# Patient Record
Sex: Male | Born: 1960 | Race: Black or African American | Hispanic: No | Marital: Single | Smoking: Current every day smoker
Health system: Southern US, Community
[De-identification: ages and names within clinical notes are randomized; demographics above are authoritative.]

## PROBLEM LIST (undated history)

## (undated) DIAGNOSIS — I10 Essential (primary) hypertension: Secondary | ICD-10-CM

---

## 2002-02-25 ENCOUNTER — Emergency Department (HOSPITAL_COMMUNITY): Admission: EM | Admit: 2002-02-25 | Discharge: 2002-02-26 | Payer: Self-pay | Admitting: Emergency Medicine

## 2004-01-11 ENCOUNTER — Emergency Department (HOSPITAL_COMMUNITY): Admission: EM | Admit: 2004-01-11 | Discharge: 2004-01-12 | Payer: Self-pay | Admitting: Emergency Medicine

## 2005-07-17 ENCOUNTER — Emergency Department (HOSPITAL_COMMUNITY): Admission: EM | Admit: 2005-07-17 | Discharge: 2005-07-17 | Payer: Self-pay | Admitting: *Deleted

## 2013-04-09 ENCOUNTER — Emergency Department (HOSPITAL_COMMUNITY)
Admission: EM | Admit: 2013-04-09 | Discharge: 2013-04-10 | Disposition: A | Discharge status: Home / Self Care | Payer: Medicaid Other | Attending: Emergency Medicine | Admitting: Emergency Medicine

## 2013-04-09 ENCOUNTER — Encounter (HOSPITAL_COMMUNITY): Payer: Self-pay | Admitting: *Deleted

## 2013-04-09 DIAGNOSIS — R079 Chest pain, unspecified: Secondary | ICD-10-CM | POA: Insufficient documentation

## 2013-04-09 DIAGNOSIS — R0602 Shortness of breath: Secondary | ICD-10-CM | POA: Insufficient documentation

## 2013-04-09 DIAGNOSIS — M79609 Pain in unspecified limb: Secondary | ICD-10-CM | POA: Insufficient documentation

## 2013-04-09 NOTE — ED Notes (Signed)
Pt states he was sitting at home when he had sharp pain going from his legs up to his chest.

## 2013-04-09 NOTE — ED Notes (Signed)
Pt to department via EMS.  Pt c/o pain in leg that shot up to his chest.  Pt does report mild SOB, and improvement in pain at this time. Pt reporting pain is stabbing and presently more in center of chest.  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

## 2013-04-10 ENCOUNTER — Emergency Department (HOSPITAL_COMMUNITY): Payer: Medicaid Other

## 2013-04-10 LAB — BASIC METABOLIC PANEL
BUN: 12 mg/dL (ref 6–23)
CO2: 19 mEq/L (ref 19–32)
Calcium: 9.1 mg/dL (ref 8.4–10.5)
Creatinine, Ser: 0.93 mg/dL (ref 0.50–1.35)

## 2013-04-10 LAB — CBC WITH DIFFERENTIAL/PLATELET
Basophils Absolute: 0 10*3/uL (ref 0.0–0.1)
Eosinophils Relative: 3 % (ref 0–5)
HCT: 43.7 % (ref 39.0–52.0)
Lymphocytes Relative: 39 % (ref 12–46)
MCHC: 35.9 g/dL (ref 30.0–36.0)
MCV: 98.2 fL (ref 78.0–100.0)
Monocytes Absolute: 0.8 10*3/uL (ref 0.1–1.0)
Monocytes Relative: 10 % (ref 3–12)
RDW: 13.9 % (ref 11.5–15.5)

## 2013-04-10 LAB — POCT I-STAT TROPONIN I: Troponin i, poc: 0 ng/mL (ref 0.00–0.08)
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
## 2017-11-11 ENCOUNTER — Other Ambulatory Visit (HOSPITAL_COMMUNITY): Payer: Self-pay | Admitting: Internal Medicine

## 2017-11-11 ENCOUNTER — Ambulatory Visit (HOSPITAL_COMMUNITY)
Admission: RE | Admit: 2017-11-11 | Discharge: 2017-11-11 | Disposition: A | Discharge status: Home / Self Care | Payer: Medicaid Other | Source: Ambulatory Visit | Attending: Internal Medicine | Admitting: Internal Medicine

## 2017-11-11 DIAGNOSIS — R059 Cough, unspecified: Secondary | ICD-10-CM

## 2017-11-11 DIAGNOSIS — R0989 Other specified symptoms and signs involving the circulatory and respiratory systems: Secondary | ICD-10-CM

## 2017-11-11 DIAGNOSIS — R918 Other nonspecific abnormal finding of lung field: Secondary | ICD-10-CM | POA: Insufficient documentation

## 2017-11-11 DIAGNOSIS — R05 Cough: Secondary | ICD-10-CM | POA: Diagnosis not present

## 2017-11-11 DIAGNOSIS — Q240 Dextrocardia: Secondary | ICD-10-CM | POA: Diagnosis not present
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
## 2018-08-25 ENCOUNTER — Ambulatory Visit: Payer: Medicaid Other

## 2018-08-25 ENCOUNTER — Telehealth: Payer: Self-pay

## 2018-08-25 NOTE — Telephone Encounter (Signed)
PATIENT WAS A NO SHOW AND LETTER SENT  °

## 2018-08-25 NOTE — Telephone Encounter (Signed)
noted 

## 2018-08-26 ENCOUNTER — Telehealth: Payer: Self-pay

## 2018-08-26 ENCOUNTER — Ambulatory Visit: Payer: Medicaid Other

## 2018-08-26 NOTE — Telephone Encounter (Signed)
PATIENT WAS A NO SHOW AND LETTER SENT  °

## 2018-08-26 NOTE — Telephone Encounter (Signed)
noted 

## 2019-11-26 ENCOUNTER — Other Ambulatory Visit: Payer: Self-pay

## 2019-11-26 ENCOUNTER — Encounter (HOSPITAL_COMMUNITY): Payer: Self-pay

## 2019-11-26 ENCOUNTER — Emergency Department (HOSPITAL_COMMUNITY)
Admission: EM | Admit: 2019-11-26 | Discharge: 2019-11-26 | Disposition: A | Discharge status: Home / Self Care | Payer: Medicaid Other | Attending: Emergency Medicine | Admitting: Emergency Medicine

## 2019-11-26 DIAGNOSIS — F17210 Nicotine dependence, cigarettes, uncomplicated: Secondary | ICD-10-CM | POA: Diagnosis not present

## 2019-11-26 DIAGNOSIS — R1110 Vomiting, unspecified: Secondary | ICD-10-CM | POA: Diagnosis not present

## 2019-11-26 DIAGNOSIS — I10 Essential (primary) hypertension: Secondary | ICD-10-CM | POA: Insufficient documentation

## 2019-11-26 HISTORY — DX: Essential (primary) hypertension: I10

## 2019-11-26 LAB — CBC WITH DIFFERENTIAL/PLATELET
Abs Immature Granulocytes: 0.03 10*3/uL (ref 0.00–0.07)
Basophils Absolute: 0 10*3/uL (ref 0.0–0.1)
Basophils Relative: 1 %
Eosinophils Absolute: 0 10*3/uL (ref 0.0–0.5)
Eosinophils Relative: 1 %
HCT: 42.6 % (ref 39.0–52.0)
Hemoglobin: 14.3 g/dL (ref 13.0–17.0)
Immature Granulocytes: 1 %
Lymphocytes Relative: 22 %
Lymphs Abs: 1.4 10*3/uL (ref 0.7–4.0)
MCH: 34 pg (ref 26.0–34.0)
MCHC: 33.6 g/dL (ref 30.0–36.0)
MCV: 101.4 fL — ABNORMAL HIGH (ref 80.0–100.0)
Monocytes Absolute: 0.7 10*3/uL (ref 0.1–1.0)
Monocytes Relative: 10 %
Neutro Abs: 4.4 10*3/uL (ref 1.7–7.7)
Neutrophils Relative %: 65 %
Platelets: 259 10*3/uL (ref 150–400)
RBC: 4.2 MIL/uL — ABNORMAL LOW (ref 4.22–5.81)
RDW: 14 % (ref 11.5–15.5)
WBC: 6.5 10*3/uL (ref 4.0–10.5)
nRBC: 0 % (ref 0.0–0.2)

## 2019-11-26 LAB — COMPREHENSIVE METABOLIC PANEL
ALT: 60 U/L — ABNORMAL HIGH (ref 0–44)
AST: 58 U/L — ABNORMAL HIGH (ref 15–41)
Albumin: 3.7 g/dL (ref 3.5–5.0)
Alkaline Phosphatase: 89 U/L (ref 38–126)
Anion gap: 11 (ref 5–15)
BUN: 11 mg/dL (ref 6–20)
CO2: 20 mmol/L — ABNORMAL LOW (ref 22–32)
Calcium: 8.9 mg/dL (ref 8.9–10.3)
Chloride: 107 mmol/L (ref 98–111)
Creatinine, Ser: 0.8 mg/dL (ref 0.61–1.24)
GFR calc Af Amer: >60 mL/min (ref >60)
GFR calc non Af Amer: >60 mL/min (ref >60)
Glucose, Bld: 101 mg/dL — ABNORMAL HIGH (ref 70–99)
Potassium: 3.5 mmol/L (ref 3.5–5.1)
Sodium: 138 mmol/L (ref 135–145)
Total Bilirubin: 0.6 mg/dL (ref 0.3–1.2)
Total Protein: 8.2 g/dL — ABNORMAL HIGH (ref 6.5–8.1)

## 2019-11-26 MED ORDER — AMLODIPINE BESYLATE 5 MG PO TABS: 5.0000 mg | ORAL_TABLET | Freq: Every day | ORAL | 1 refills | Status: DC

## 2019-11-26 MED ORDER — AMLODIPINE BESYLATE 10 MG PO TABS: 10.0000 mg | ORAL_TABLET | Freq: Every day | ORAL | 1 refills | Status: AC

## 2019-11-26 MED ORDER — AMLODIPINE BESYLATE 5 MG PO TABS
5.0000 mg | ORAL_TABLET | Freq: Once | ORAL | Status: AC
  Administered 2019-11-26: 11:00:00 5 mg via ORAL
  Filled 2019-11-26: qty 1

## 2019-11-26 NOTE — ED Provider Notes (Signed)
[facility] EMERGENCY DEPARTMENT Provider Note   CSN: [id number] Arrival date & time: 11/26/19  8295     History Chief Complaint  Patient presents with  . Hypertension    Henry Hunt is a 58 y.o. male.  Patient presents after episode of elevated blood pressure vomiting 3 times and feeling generalized shakiness this morning.  Symptoms have all resolved.  No unilateral weakness, no vision loss, no headache, no speech changes.  No history of stroke or cardiac history.  Patient has not taken his blood pressure medication in the past month.  Patient currently has no signs or symptoms.        Past Medical History:  Diagnosis Date  . Hypertension     There are no problems to display for this patient.   History reviewed. No pertinent surgical history.     No family history on file.  Social History   Tobacco Use  . Smoking status: Current Every Day Smoker    Packs/day: 0.50    Years: 25.00    Pack years: 12.50    Types: Cigarettes  Substance Use Topics  . Alcohol use: Yes    Comment: 2 beers daily  . Drug use: No    Home Medications Prior to Admission medications   Medication Sig Start Date End Date Taking? Authorizing Provider  amLODipine (NORVASC) 5 MG tablet Take 1 tablet (5 mg total) by mouth daily. 11/26/19   Blane Ohara, MD    Allergies    Patient has no known allergies.  Review of Systems   Review of Systems  Constitutional: Negative for chills and fever.  HENT: Negative for congestion.   Eyes: Negative for visual disturbance.  Respiratory: Negative for shortness of breath.   Cardiovascular: Negative for chest pain.  Gastrointestinal: Positive for vomiting. Negative for abdominal pain and diarrhea.  Genitourinary: Negative for dysuria and flank pain.  Musculoskeletal: Negative for back pain, neck pain and neck stiffness.  Skin: Negative for rash.  Neurological: Negative for speech difficulty, weakness, light-headedness and headaches.     Physical Exam Updated Vital Signs BP (!) 147/92   Pulse 68   Temp 98 F (36.7 C) (Oral)   Resp 13   Ht 5\' 4"  (1.626 m)   Wt 86.2 kg   SpO2 100%   BMI 32.61 kg/m   Physical Exam Vitals and nursing note reviewed.  Constitutional:      Appearance: He is well-developed.  HENT:     Head: Normocephalic and atraumatic.  Eyes:     General:        Right eye: No discharge.        Left eye: No discharge.     Conjunctiva/sclera: Conjunctivae normal.  Neck:     Trachea: No tracheal deviation.  Cardiovascular:     Rate and Rhythm: Normal rate and regular rhythm.     Pulses: Normal pulses.     Heart sounds: No murmur.  Pulmonary:     Effort: Pulmonary effort is normal.     Breath sounds: Normal breath sounds.  Abdominal:     General: There is no distension.     Palpations: Abdomen is soft.     Tenderness: There is no abdominal tenderness. There is no guarding.  Musculoskeletal:        General: No swelling or tenderness. Normal range of motion.     Cervical back: Normal range of motion and neck supple.  Skin:    General: Skin is warm.  Capillary Refill: Capillary refill takes less than 2 seconds.     Findings: No rash.  Neurological:     Mental Status: He is alert and oriented to person, place, and time.     GCS: GCS eye subscore is 4. GCS verbal subscore is 5. GCS motor subscore is 6.     Comments: 5+ strength in UE and LE with f/e at major joints. Sensation to palpation intact in UE and LE. CNs 2-12 grossly intact.  EOMFI.  PERRL.   Finger nose and coordination intact bilateral.   Visual fields intact to finger testing. No nystagmus   Psychiatric:        Mood and Affect: Mood normal.     ED Results / Procedures / Treatments   Labs (all labs ordered are listed, but only abnormal results are displayed) Labs Reviewed  CBC WITH DIFFERENTIAL/PLATELET - Abnormal; Notable for the following components:      Result Value   RBC 4.20 (*)    MCV 101.4 (*)    All  other components within normal limits  COMPREHENSIVE METABOLIC PANEL - Abnormal; Notable for the following components:   CO2 20 (*)    Glucose, Bld 101 (*)    Total Protein 8.2 (*)    AST 58 (*)    ALT 60 (*)    All other components within normal limits    EKG EKG Interpretation  Date/Time:  Friday November 26 2019 09:37:18 EST Ventricular Rate:  73 PR Interval:    QRS Duration: 94 QT Interval:  463 QTC Calculation: 511 R Axis:   165 Text Interpretation: Sinus rhythm Right ventricular hypertrophy Flattened T waves Prolonged QT interval Poor baseline Confirmed by Blane OharaZavitz, Sherri Levenhagen [phone]([id number]) on 11/26/2019 11:04:35 AM   Radiology No results found.  Procedures Procedures (including critical care time)  Medications Ordered in ED Medications  amLODipine (NORVASC) tablet 5 mg (has no administration in time range)    ED Course  I have reviewed the triage vital signs and the nursing notes.  Pertinent labs & imaging results that were available during my care of the patient were reviewed by me and considered in my medical decision making (see chart for details).    MDM Rules/Calculators/A&P                     Patient presents for evaluation with elevated blood pressure after episodes of vomiting.  Patient has mild elevated blood pressure in the ER.  Patient has no signs or symptoms.  Patient has normal cardiac and neurologic exam.  Blood work ordered and reviewed normal kidney function, normal hemoglobin.  Patient stable for close outpatient follow-up and strict reasons to return given.  Amlodipine first dose given in the ER.  Results and differential diagnosis were discussed with the patient/parent/guardian. Xrays were independently reviewed by myself.  Close follow up outpatient was discussed, comfortable with the plan.   Medications  amLODipine (NORVASC) tablet 5 mg (has no administration in time range)    Vitals:   11/26/19 0937 11/26/19 1000 11/26/19 1030  BP: (!) 182/97  (!) 150/97 (!) 147/92  Pulse: 74 (!) 58 68  Resp: 18 14 13   Temp: 98 F (36.7 C)    TempSrc: Oral    SpO2: 100% 99% 100%  Weight: 86.2 kg    Height: 5\' 4"  (1.626 m)      Final diagnoses:  Vomiting in adult  Essential hypertension    Final Clinical Impression(s) / ED Diagnoses Final diagnoses:  Vomiting in adult  Essential hypertension    Rx / DC Orders ED Discharge Orders         Ordered    amLODipine (NORVASC) 5 MG tablet  Daily     11/26/19 1122           Elnora Morrison, MD 11/26/19 1125

## 2019-11-26 NOTE — ED Triage Notes (Signed)
Pt called EMS due to feeling shaky and vomited. Pt has not taken BP meds in over a month initial BP 206/122, then 189/106

## 2019-11-26 NOTE — Discharge Instructions (Addendum)
It is very important for you to take your blood pressure medications as directed and follow-up with a primary doctor.  Return for chest pain, concerning headaches, shortness of breath, strokelike symptoms or new concerns. Start taking your amlodipine again 10 mg once daily until you see your physician. Your blood work looked okay today, your kidney function and hemoglobin were normal.

## 2020-04-15 ENCOUNTER — Ambulatory Visit: Payer: Medicaid Other | Attending: Internal Medicine

## 2020-04-15 DIAGNOSIS — Z23 Encounter for immunization: Secondary | ICD-10-CM

## 2020-04-15 NOTE — Progress Notes (Signed)
   Covid-19 Vaccination Clinic  Name:  Henry Hunt    MRN: [id number] DOB: [birth year]  04/15/2020  Henry Hunt was observed post Covid-19 immunization for 15 minutes without incident. He was provided with Vaccine Information Sheet and instruction to access the V-Safe system.   Henry Hunt was instructed to call 911 with any severe reactions post vaccine: Marland Kitchen Difficulty breathing  . Swelling of face and throat  . A fast heartbeat  . A bad rash all over body  . Dizziness and weakness   Immunizations Administered    Name Date Dose VIS Date Route   Moderna COVID-19 Vaccine 04/15/2020 12:27 PM 0.5 mL 11/2019 Intramuscular   Manufacturer: Moderna   Lot: [id number]   NDC: 47998-721-58

## 2020-05-18 ENCOUNTER — Ambulatory Visit: Payer: Medicaid Other

## 2021-10-26 ENCOUNTER — Encounter: Payer: Self-pay | Admitting: Internal Medicine

## 2021-12-13 ENCOUNTER — Ambulatory Visit: Payer: Medicaid Other

## 2022-01-16 DIAGNOSIS — Z6831 Body mass index (BMI) 31.0-31.9, adult: Secondary | ICD-10-CM | POA: Diagnosis not present

## 2022-01-16 DIAGNOSIS — I10 Essential (primary) hypertension: Secondary | ICD-10-CM | POA: Diagnosis not present

## 2022-01-16 DIAGNOSIS — J410 Simple chronic bronchitis: Secondary | ICD-10-CM | POA: Diagnosis not present

## 2022-04-25 DIAGNOSIS — I10 Essential (primary) hypertension: Secondary | ICD-10-CM | POA: Diagnosis not present

## 2022-04-25 DIAGNOSIS — Z6832 Body mass index (BMI) 32.0-32.9, adult: Secondary | ICD-10-CM | POA: Diagnosis not present

## 2022-04-25 DIAGNOSIS — J410 Simple chronic bronchitis: Secondary | ICD-10-CM | POA: Diagnosis not present

## 2022-07-16 DIAGNOSIS — I10 Essential (primary) hypertension: Secondary | ICD-10-CM | POA: Diagnosis not present

## 2022-07-16 DIAGNOSIS — M5451 Vertebrogenic low back pain: Secondary | ICD-10-CM | POA: Diagnosis not present

## 2022-07-16 DIAGNOSIS — J410 Simple chronic bronchitis: Secondary | ICD-10-CM | POA: Diagnosis not present

## 2022-12-09 DIAGNOSIS — Z419 Encounter for procedure for purposes other than remedying health state, unspecified: Secondary | ICD-10-CM | POA: Diagnosis not present

## 2023-01-09 DIAGNOSIS — Z419 Encounter for procedure for purposes other than remedying health state, unspecified: Secondary | ICD-10-CM | POA: Diagnosis not present

## 2023-02-07 DIAGNOSIS — Z419 Encounter for procedure for purposes other than remedying health state, unspecified: Secondary | ICD-10-CM | POA: Diagnosis not present

## 2023-03-10 DIAGNOSIS — Z419 Encounter for procedure for purposes other than remedying health state, unspecified: Secondary | ICD-10-CM | POA: Diagnosis not present

## 2023-04-09 DIAGNOSIS — Z419 Encounter for procedure for purposes other than remedying health state, unspecified: Secondary | ICD-10-CM | POA: Diagnosis not present

## 2023-05-10 DIAGNOSIS — Z419 Encounter for procedure for purposes other than remedying health state, unspecified: Secondary | ICD-10-CM | POA: Diagnosis not present

## 2023-06-09 DIAGNOSIS — Z419 Encounter for procedure for purposes other than remedying health state, unspecified: Secondary | ICD-10-CM | POA: Diagnosis not present

## 2023-07-10 DIAGNOSIS — Z419 Encounter for procedure for purposes other than remedying health state, unspecified: Secondary | ICD-10-CM | POA: Diagnosis not present

## 2023-08-10 DIAGNOSIS — Z419 Encounter for procedure for purposes other than remedying health state, unspecified: Secondary | ICD-10-CM | POA: Diagnosis not present

## 2023-09-09 DIAGNOSIS — Z419 Encounter for procedure for purposes other than remedying health state, unspecified: Secondary | ICD-10-CM | POA: Diagnosis not present

## 2023-10-10 DIAGNOSIS — Z419 Encounter for procedure for purposes other than remedying health state, unspecified: Secondary | ICD-10-CM | POA: Diagnosis not present

## 2023-11-09 DIAGNOSIS — Z419 Encounter for procedure for purposes other than remedying health state, unspecified: Secondary | ICD-10-CM | POA: Diagnosis not present

## 2023-12-10 DIAGNOSIS — Z419 Encounter for procedure for purposes other than remedying health state, unspecified: Secondary | ICD-10-CM | POA: Diagnosis not present

## 2024-01-10 DIAGNOSIS — Z419 Encounter for procedure for purposes other than remedying health state, unspecified: Secondary | ICD-10-CM | POA: Diagnosis not present

## 2024-02-07 DIAGNOSIS — Z419 Encounter for procedure for purposes other than remedying health state, unspecified: Secondary | ICD-10-CM | POA: Diagnosis not present

## 2024-03-20 DIAGNOSIS — Z419 Encounter for procedure for purposes other than remedying health state, unspecified: Secondary | ICD-10-CM | POA: Diagnosis not present

## 2024-04-19 DIAGNOSIS — Z419 Encounter for procedure for purposes other than remedying health state, unspecified: Secondary | ICD-10-CM | POA: Diagnosis not present

## 2024-05-20 DIAGNOSIS — Z419 Encounter for procedure for purposes other than remedying health state, unspecified: Secondary | ICD-10-CM | POA: Diagnosis not present

## 2024-06-03 DIAGNOSIS — J410 Simple chronic bronchitis: Secondary | ICD-10-CM | POA: Diagnosis not present

## 2024-06-03 DIAGNOSIS — I10 Essential (primary) hypertension: Secondary | ICD-10-CM | POA: Diagnosis not present

## 2024-06-07 ENCOUNTER — Emergency Department (HOSPITAL_COMMUNITY)
Admission: EM | Admit: 2024-06-07 | Discharge: 2024-06-07 | Disposition: A | Discharge status: Home / Self Care | Attending: Emergency Medicine | Admitting: Emergency Medicine

## 2024-06-07 ENCOUNTER — Other Ambulatory Visit: Payer: Self-pay

## 2024-06-07 ENCOUNTER — Ambulatory Visit (HOSPITAL_COMMUNITY)
Admission: RE | Admit: 2024-06-07 | Discharge: 2024-06-07 | Disposition: A | Discharge status: Home / Self Care | Source: Ambulatory Visit | Attending: Gerontology | Admitting: Gerontology

## 2024-06-07 ENCOUNTER — Other Ambulatory Visit (HOSPITAL_COMMUNITY): Payer: Self-pay | Admitting: Gerontology

## 2024-06-07 ENCOUNTER — Emergency Department (HOSPITAL_COMMUNITY)

## 2024-06-07 ENCOUNTER — Encounter (HOSPITAL_COMMUNITY): Payer: Self-pay

## 2024-06-07 DIAGNOSIS — R779 Abnormality of plasma protein, unspecified: Secondary | ICD-10-CM | POA: Diagnosis not present

## 2024-06-07 DIAGNOSIS — B370 Candidal stomatitis: Secondary | ICD-10-CM | POA: Insufficient documentation

## 2024-06-07 DIAGNOSIS — J029 Acute pharyngitis, unspecified: Secondary | ICD-10-CM | POA: Diagnosis not present

## 2024-06-07 DIAGNOSIS — Z0001 Encounter for general adult medical examination with abnormal findings: Secondary | ICD-10-CM | POA: Diagnosis not present

## 2024-06-07 DIAGNOSIS — M109 Gout, unspecified: Secondary | ICD-10-CM | POA: Diagnosis not present

## 2024-06-07 DIAGNOSIS — Q240 Dextrocardia: Secondary | ICD-10-CM | POA: Diagnosis not present

## 2024-06-07 DIAGNOSIS — I10 Essential (primary) hypertension: Secondary | ICD-10-CM | POA: Diagnosis not present

## 2024-06-07 DIAGNOSIS — R059 Cough, unspecified: Secondary | ICD-10-CM

## 2024-06-07 DIAGNOSIS — J069 Acute upper respiratory infection, unspecified: Secondary | ICD-10-CM | POA: Insufficient documentation

## 2024-06-07 DIAGNOSIS — R739 Hyperglycemia, unspecified: Secondary | ICD-10-CM | POA: Diagnosis not present

## 2024-06-07 DIAGNOSIS — D72829 Elevated white blood cell count, unspecified: Secondary | ICD-10-CM | POA: Diagnosis not present

## 2024-06-07 DIAGNOSIS — Z79899 Other long term (current) drug therapy: Secondary | ICD-10-CM | POA: Diagnosis not present

## 2024-06-07 LAB — CBC WITH DIFFERENTIAL/PLATELET
Abs Immature Granulocytes: 0.16 10*3/uL — ABNORMAL HIGH (ref 0.00–0.07)
Basophils Absolute: 0.1 10*3/uL (ref 0.0–0.1)
Basophils Relative: 0 %
Eosinophils Absolute: 0.2 10*3/uL (ref 0.0–0.5)
Eosinophils Relative: 1 %
HCT: 43.3 % (ref 39.0–52.0)
Hemoglobin: 15.1 g/dL (ref 13.0–17.0)
Immature Granulocytes: 1 %
Lymphocytes Relative: 12 %
Lymphs Abs: 2.3 10*3/uL (ref 0.7–4.0)
MCH: 35.4 pg — ABNORMAL HIGH (ref 26.0–34.0)
MCHC: 34.9 g/dL (ref 30.0–36.0)
MCV: 101.6 fL — ABNORMAL HIGH (ref 80.0–100.0)
Monocytes Absolute: 1.6 10*3/uL — ABNORMAL HIGH (ref 0.1–1.0)
Monocytes Relative: 8 %
Neutro Abs: 14.4 10*3/uL — ABNORMAL HIGH (ref 1.7–7.7)
Neutrophils Relative %: 78 %
Platelets: 274 10*3/uL (ref 150–400)
RBC: 4.26 MIL/uL (ref 4.22–5.81)
RDW: 13.2 % (ref 11.5–15.5)
WBC: 18.7 10*3/uL — ABNORMAL HIGH (ref 4.0–10.5)
nRBC: 0 % (ref 0.0–0.2)

## 2024-06-07 LAB — RESP PANEL BY RT-PCR (RSV, FLU A&B, COVID)  RVPGX2
Influenza A by PCR: NEGATIVE
Influenza B by PCR: NEGATIVE
Resp Syncytial Virus by PCR: NEGATIVE
SARS Coronavirus 2 by RT PCR: NEGATIVE

## 2024-06-07 LAB — BASIC METABOLIC PANEL WITH GFR
Anion gap: 15 (ref 5–15)
BUN: 14 mg/dL (ref 8–23)
CO2: 18 mmol/L — ABNORMAL LOW (ref 22–32)
Calcium: 8.9 mg/dL (ref 8.9–10.3)
Chloride: 105 mmol/L (ref 98–111)
Creatinine, Ser: 0.82 mg/dL (ref 0.61–1.24)
GFR, Estimated: >60 mL/min (ref >60)
Glucose, Bld: 87 mg/dL (ref 70–99)
Potassium: 3.2 mmol/L — ABNORMAL LOW (ref 3.5–5.1)
Sodium: 138 mmol/L (ref 135–145)

## 2024-06-07 LAB — GROUP A STREP BY PCR: Group A Strep by PCR: NOT DETECTED

## 2024-06-07 LAB — CBG MONITORING, ED: Glucose-Capillary: 90 mg/dL (ref 70–99)

## 2024-06-07 MED ORDER — IOHEXOL 300 MG/ML  SOLN
75.0000 mL | Freq: Once | INTRAMUSCULAR | Status: AC | PRN
Start: 2024-06-07 — End: 2024-06-07
  Administered 2024-06-07: 75 mL via INTRAVENOUS

## 2024-06-07 MED ORDER — NYSTATIN 100000 UNIT/ML MT SUSP: 500000.0000 [IU] | Freq: Four times a day (QID) | OROMUCOSAL | 0 refills | Status: DC

## 2024-06-07 MED ORDER — KETOROLAC TROMETHAMINE 15 MG/ML IJ SOLN
15.0000 mg | Freq: Once | INTRAMUSCULAR | Status: AC
  Administered 2024-06-07: 15 mg via INTRAMUSCULAR
  Filled 2024-06-07: qty 1

## 2024-06-07 MED ORDER — NYSTATIN 100000 UNIT/ML MT SUSP
5.0000 mL | OROMUCOSAL | Status: AC
  Administered 2024-06-07: 500000 [IU] via ORAL
  Filled 2024-06-07: qty 5
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
## 2024-06-07 NOTE — Discharge Instructions (Addendum)
 You were seen for your upper respiratory tract infection in the emergency department.   At home, please use Tylenol and ibuprofen for your sore throat, muscle aches, and fevers.  Please use over-the-counter cough medication or tea with honey for your cough.  Take the nystatin for your thrush.  Check your MyChart or call the department for the results of your tests.  Follow-up with your primary doctor in 2-3 days regarding your visit.  This may be over the phone.  Return immediately to the emergency department if you experience any of the following: Difficulty breathing, or any other concerning symptoms.    Thank you for visiting our Emergency Department. It was a pleasure taking care of you today.

## 2024-06-07 NOTE — ED Notes (Signed)
 Contacted [facility] to come pick up the patient following discharge.

## 2024-06-07 NOTE — ED Triage Notes (Signed)
 Pt arrived via POV c/o sore throat X 3 days. Pt endorses difficulty swallowing, sinus congestion and a cough.

## 2024-06-08 LAB — HIV ANTIBODY (ROUTINE TESTING W REFLEX): HIV Screen 4th Generation wRfx: NONREACTIVE

## 2024-06-16 ENCOUNTER — Ambulatory Visit: Payer: Self-pay

## 2024-06-16 DIAGNOSIS — M545 Low back pain, unspecified: Secondary | ICD-10-CM | POA: Diagnosis not present

## 2024-06-16 DIAGNOSIS — J410 Simple chronic bronchitis: Secondary | ICD-10-CM | POA: Diagnosis not present

## 2024-06-16 DIAGNOSIS — I10 Essential (primary) hypertension: Secondary | ICD-10-CM | POA: Diagnosis not present

## 2024-06-16 NOTE — Telephone Encounter (Signed)
 FYI Only or Action Required?: FYI only for provider.  Called Nurse Triage reporting Shortness of Breath and Hypertension, blurred vision, weakness, CP.  Symptoms began yesterday.  Interventions attempted: Nothing.  Symptoms are: unchanged.  Triage Disposition: Call EMS 911 Now  Patient/caregiver understands and will follow disposition?: No  4. HISTORY: Do you have a history of high blood pressure? Hx and controlled with medications  5. MEDICINES: Are you taking any medicines for blood pressure? Have you missed any doses recently? Taking medications as prescribed  6. OTHER SYMPTOMS: Do you have any symptoms? (e.g., blurred vision, chest pain, difficulty breathing, headache, weakness) Blurred vision, chest pain, difficulty breathing, HA, L sided weakness  Pt niece originally reports that pt started with all s/s yesterday. At this time RN advised ED. Caller stated that pt was seen in the ED last week, I'm calling to schedule a PCP visit. RN attempted to clarify when unilateral weakness started, niece stated that she does not feel it is unilateral, however pt reports unilateral. New pt visit was scheduled. Caller was advised numerous times that pt needs to go to the ED for weakness, vision changes, CP and SOB as niece reported these s/s.    Reason for Disposition  [1] Weakness of the face, arm or leg on one side of the body AND [2] new-onset  Protocols used: Blood Pressure - High-A-AH

## 2024-06-16 NOTE — Telephone Encounter (Signed)
 This RN started triage and family member requested this RN to hang up and call the patient's niece back at [phone]. This RN made first attempt to contact niece. No answer, LVM. Routing for additional attempts.   Copied from CRM [id number]. Topic: Clinical - Red Word Triage >> Jun 16, 2024  2:56 PM Antwanette L wrote: Red Word that prompted transfer to Nurse Triage:  bp is 151/ do not remember the bottom number. Patient is experiencing sob.  1. BLOOD PRESSURE: What is your blood pressure? Did you take at least two measurements 5 minutes apart? 151/91  2. ONSET: When did you take your blood pressure? BP taken a few minutes ago   3. HOW: How did you take your blood pressure? (e.g., automatic home BP monitor, visiting nurse) Automatic cuff at home  4. HISTORY: Do you have a history of high blood pressure? Did not ask yet  5. MEDICINES: Are you taking any medicines for blood pressure? Have you missed any doses recently? Did not ask yet  6. OTHER SYMPTOMS: Do you have any symptoms? (e.g., blurred vision, chest pain, difficulty breathing, headache, weakness) Did not ask yet

## 2024-06-19 DIAGNOSIS — Z419 Encounter for procedure for purposes other than remedying health state, unspecified: Secondary | ICD-10-CM | POA: Diagnosis not present

## 2024-06-22 ENCOUNTER — Ambulatory Visit: Payer: Self-pay | Admitting: Nurse Practitioner

## 2024-06-28 ENCOUNTER — Encounter: Payer: Self-pay | Admitting: Nurse Practitioner

## 2024-07-20 DIAGNOSIS — Z419 Encounter for procedure for purposes other than remedying health state, unspecified: Secondary | ICD-10-CM | POA: Diagnosis not present

## 2024-08-20 DIAGNOSIS — Z419 Encounter for procedure for purposes other than remedying health state, unspecified: Secondary | ICD-10-CM | POA: Diagnosis not present

## 2024-09-27 ENCOUNTER — Ambulatory Visit

## 2024-09-27 DIAGNOSIS — Z23 Encounter for immunization: Secondary | ICD-10-CM

## 2025-01-13 ENCOUNTER — Telehealth: Payer: Self-pay | Admitting: Internal Medicine

## 2025-01-13 NOTE — Telephone Encounter (Signed)
 Called pt's transportation to confirm @ [phone]. Got a hold of joanna G, and she verified the needed transportation for tomorrow 01/14/25.-aw

## 2025-01-14 ENCOUNTER — Encounter: Payer: Self-pay | Admitting: Family

## 2025-01-14 ENCOUNTER — Telehealth: Payer: Self-pay

## 2025-01-14 ENCOUNTER — Ambulatory Visit: Admitting: Family

## 2025-01-14 VITALS — BP 130/80 | HR 84 | Temp 98.2°F | Ht 64.0 in | Wt 179.6 lb

## 2025-01-14 DIAGNOSIS — I10 Essential (primary) hypertension: Secondary | ICD-10-CM | POA: Insufficient documentation

## 2025-01-14 DIAGNOSIS — F17218 Nicotine dependence, cigarettes, with other nicotine-induced disorders: Secondary | ICD-10-CM

## 2025-01-14 DIAGNOSIS — J4520 Mild intermittent asthma, uncomplicated: Secondary | ICD-10-CM

## 2025-01-14 MED ORDER — ALBUTEROL SULFATE HFA 108 (90 BASE) MCG/ACT IN AERS: 1.0000 | INHALATION_SPRAY | Freq: Four times a day (QID) | RESPIRATORY_TRACT | 2 refills | Status: AC | PRN

## 2025-01-14 MED ORDER — AMLODIPINE BESYLATE 2.5 MG PO TABS: 2.5000 mg | ORAL_TABLET | Freq: Every day | ORAL | 2 refills | Status: AC

## 2025-01-14 MED ORDER — VARENICLINE TARTRATE 0.5 MG PO TABS: 0.5000 mg | ORAL_TABLET | ORAL | 1 refills | Status: AC

## 2025-01-14 NOTE — Progress Notes (Signed)
 "  New Patient Office Visit  Subjective:  Patient ID: Henry Hunt, male    DOB: 12-14-1960  Age: 64 y.o. MRN: [id number]  CC:  Chief Complaint  Patient presents with   New Patient (Initial Visit)   Asthma    Pt would like to discuss inhalers.    Dizziness    Pt c/o dizziness and headaches, present for years.    Hypertension    Pt has been off of Amlodipine  for a few months and would like a refill of medication.    HPI Henry Hunt presents for establishing care today.  Discussed the use of AI scribe software for clinical note transcription with the patient, who gave verbal consent to proceed.  History of Present Illness Henry Hunt is a 64 year old male with hypertension who presents for medication management and follow-up.  He has had inconsistent hypertension treatment for over a year after running out of medication and having difficulty with refills. He was taking amlodipine  5 mg, which was increased to 10 mg during a hospital visit, then later reduced back to 5 mg. He now has morning dizziness and headaches that improve during the day. He does not monitor his blood pressure at home. He smokes and feels smoking may worsen his symptoms. He lives in a noisy boarding house, which disrupts his sleep and increases stress. He has asthma related to prior left lung trauma and uses an albuterol  inhaler as needed. His weight decreased from over 200 pounds to 179 pounds and he is trying to lose more to 165 pounds.  Assessment & Plan Essential hypertension Hypertension previously managed with amlodipine , currently untreated. Previously on Amlodipine  5mg  qd. Occasional dizziness and headaches noted, possibly due to blood pressure fluctuations. Stress and living situation may contribute. - Prescribed amlodipine  2.5 mg daily. - Advised on low sodium diet, increase water intake to 2.5L daily, smoking cessation. - Plan for lab work at follow-up to check kidney function, liver  function, and cholesterol levels. - Schedule follow-up in 6 weeks to assess blood pressure control and symptoms.  Mild intermittent asthma Asthma managed with as-needed albuterol  inhaler. No daily inhaler use. Previous lung injury but no COPD diagnosis. - Prescribed albuterol  inhaler with refills for as-needed use. - F/U prn  Nicotine dependence, cigarettes Long-standing nicotine dependence with readiness to quit. Discussed Chantix  side effects and advised morning dosing to minimize them. Potential for increased appetite and weight gain noted. - Prescribed generic Chantix  0.5mg  with instructions to start with one pill, increase to two, and then three or four daily as needed to quit smoking. - Advised to continue Chantix  for at least one to two months after quitting smoking to prevent cravings. - Encouraged smoking cessation and discussed potential side effects of Chantix . - F/U in 6 weeks    Outpatient Medications Prior to Visit  Medication Sig Dispense Refill   amLODipine  (NORVASC ) 10 MG tablet Take 1 tablet (10 mg total) by mouth daily. 30 tablet 1   nystatin  (MYCOSTATIN ) 100000 UNIT/ML suspension Take 5 mLs (500,000 Units total) by mouth 4 (four) times daily. 60 mL 0   No facility-administered medications prior to visit.   Past Medical History:  Diagnosis Date   Hypertension    History reviewed. No pertinent surgical history.  Objective:   Today's Vitals: BP 130/80 (BP Location: Left Arm, Patient Position: Sitting, Cuff Size: Large)   Pulse 84   Temp 98.2 F (36.8 C) (Temporal)   Ht 5' 4 (1.626 m)  Wt 179 lb 9.6 oz (81.5 kg)   SpO2 96%   BMI 30.83 kg/m   Physical Exam Vitals and nursing note reviewed.  Constitutional:      General: He is not in acute distress.    Appearance: Normal appearance. He is obese.  HENT:     Head: Normocephalic.  Cardiovascular:     Rate and Rhythm: Normal rate and regular rhythm.  Pulmonary:     Effort: Pulmonary effort is normal.      Breath sounds: Normal breath sounds.  Musculoskeletal:        General: Normal range of motion.     Cervical back: Normal range of motion.  Skin:    General: Skin is warm and dry.  Neurological:     Mental Status: He is alert and oriented to person, place, and time.  Psychiatric:        Mood and Affect: Mood normal.    Meds ordered this encounter  Medications   amLODipine  (NORVASC ) 2.5 MG tablet    Sig: Take 1 tablet (2.5 mg total) by mouth daily.    Dispense:  30 tablet    Refill:  2    Supervising Provider:   ANDY, CAMILLE L [[id number]]   albuterol  (VENTOLIN  HFA) 108 (90 Base) MCG/ACT inhaler    Sig: Inhale 1-2 puffs into the lungs every 6 (six) hours as needed for wheezing or shortness of breath.    Dispense:  6.7 g    Refill:  2    Supervising Provider:   ANDY, CAMILLE L [[id number]]   varenicline  (CHANTIX ) 0.5 MG tablet    Sig: Take 1 tablet (0.5 mg total) by mouth as directed. START with 1 pill daily AFTER eating for 1 week, then increase to 2 pills qd x 1 week, then 3 pill qam, 1 pill qpm and remain on this dose daily for 3 months. Call office for more pills.    Dispense:  90 tablet    Refill:  1    Supervising Provider:   ANDY, CAMILLE L [[id number]]   Henry Krabbe, NP "

## 2025-01-14 NOTE — Telephone Encounter (Signed)
 Copied from CRM [id number]. Topic: Clinical - Prescription Issue >> Jan 14, 2025  2:15 PM Montie POUR wrote: Reason for CRM:  Hoy with pharmacy is calling to let NP Hudnell know that they need the order for varenicline  (CHANTIX ) 0.5 MG tablet to be redone. They cannot break a pack  She needs 1 order to state for varenicline  (CHANTIX ) 0.5 MG tablet starter pack with no refills and 1 order for varenicline  (CHANTIX ) 0.5 MG tablet continue pack with 2 refills. Please call her with any questions at [phone]. Thanks

## 2025-01-14 NOTE — Patient Instructions (Addendum)
 Welcome to [facility], It was a pleasure meeting you today!    As discussed, I have sent your refills to your pharmacy. I have sent in generic Chantix  to help you stop smoking, very important!  Please schedule a 6 week follow up with fasting labs today.    PLEASE NOTE: If you had any LAB tests please let us  know if you have not heard back within a few days. You may see your results on MyChart before we have a chance to review them but we will give you a call once they are reviewed by us . If we ordered any REFERRALS today, please let us  know if you have not heard from their office within the next week.  Let us  know through MyChart if you are needing REFILLS, or have your pharmacy send us  the request. You can also use MyChart to communicate with me or any office staff.  Please try these tips to maintain a healthy lifestyle: It is important that you exercise regularly at least 30 minutes 5 times a week. Think about what you will eat, plan ahead. Choose whole foods, & think  clean, green, fresh or frozen over canned, processed or packaged foods which are more sugary, salty, and fatty. 70 to 75% of food eaten should be fresh vegetables and protein. 2-3  meals daily with healthy snacks between meals, but must be whole fruit, protein or vegetables. Aim to eat over a 10 hour period when you are active, for example, 7am to 5pm, and then STOP after your last meal of the day, drinking only water.  Shorter eating windows, 6-8 hours, are showing benefits in heart disease and blood sugar regulation. Drink water every day! Shoot for 64 ounces daily = 8 cups, no other drink is as healthy! Fruit juice is best enjoyed in a healthy way, by EATING the fruit.

## 2025-02-25 ENCOUNTER — Ambulatory Visit: Admitting: Family
